# Patient Record
Sex: Female | Born: 1995 | Race: White | Hispanic: No | Marital: Married | Smoking: Light tobacco smoker
Health system: Southern US, Community
[De-identification: ages and names within clinical notes are randomized; demographics above are authoritative.]

## PROBLEM LIST (undated history)

## (undated) DIAGNOSIS — Z789 Other specified health status: Secondary | ICD-10-CM

## (undated) HISTORY — PX: TONSILLECTOMY: SUR1361

---

## 2014-04-15 ENCOUNTER — Other Ambulatory Visit (HOSPITAL_COMMUNITY): Payer: Self-pay | Admitting: Obstetrics and Gynecology

## 2014-04-15 DIAGNOSIS — O403XX1 Polyhydramnios, third trimester, fetus 1: Secondary | ICD-10-CM

## 2014-04-15 DIAGNOSIS — R9389 Abnormal findings on diagnostic imaging of other specified body structures: Secondary | ICD-10-CM

## 2014-04-19 ENCOUNTER — Ambulatory Visit (HOSPITAL_COMMUNITY)
Admission: RE | Admit: 2014-04-19 | Discharge: 2014-04-19 | Disposition: A | Discharge status: Home / Self Care | Payer: Medicaid Other | Source: Ambulatory Visit | Attending: Obstetrics and Gynecology | Admitting: Obstetrics and Gynecology

## 2014-04-19 ENCOUNTER — Other Ambulatory Visit (HOSPITAL_COMMUNITY): Payer: Self-pay | Admitting: Obstetrics and Gynecology

## 2014-04-19 ENCOUNTER — Encounter (HOSPITAL_COMMUNITY): Payer: Self-pay

## 2014-04-19 ENCOUNTER — Other Ambulatory Visit (HOSPITAL_COMMUNITY): Payer: Self-pay

## 2014-04-19 ENCOUNTER — Ambulatory Visit (HOSPITAL_COMMUNITY): Payer: Medicaid Other

## 2014-04-19 DIAGNOSIS — Z3A35 35 weeks gestation of pregnancy: Secondary | ICD-10-CM | POA: Insufficient documentation

## 2014-04-19 DIAGNOSIS — R9389 Abnormal findings on diagnostic imaging of other specified body structures: Secondary | ICD-10-CM

## 2014-04-19 DIAGNOSIS — O403XX1 Polyhydramnios, third trimester, fetus 1: Secondary | ICD-10-CM

## 2014-04-19 DIAGNOSIS — O409XX0 Polyhydramnios, unspecified trimester, not applicable or unspecified: Secondary | ICD-10-CM | POA: Insufficient documentation

## 2014-04-19 DIAGNOSIS — O403XX0 Polyhydramnios, third trimester, not applicable or unspecified: Secondary | ICD-10-CM | POA: Diagnosis not present

## 2014-04-19 NOTE — Consult Note (Signed)
Maternal Fetal Medicine Consultation  Requesting Provider(s): Silas Floodionne Galloway, MD  Reason for consultation: Rapidly progressing, massive polyhydramnios  HPI: Martha Reynolds is a 18 year old G3P2002, EDD 05/18/2014 currently at 5337w6d who was seen for consultation due to massive polyhydramnios.   Mild polyhydramnios was initially detected on ultrasound at 29 weeks (AFI at that time was 27 cm). No fetal anomalies were detected. Her 1 hr and 3-hr OGTT were both normal.  She has been followed with weekly BPPs and AFIs since her diagnosis.  Last week, she was noted to have an AFI of > 40 and is now seen for further evaluation and recommendations for delivery.  Her Past OB history is remarkable for a term C-section due to arrest of dilation and a subsequent repeat C-section.  Her prenatal course has otherwise been uncomplicated.  She reports today that she is uncomfortable, but denies any shortness of breath or uterine contractions.  The fetus is active.  OB History: OB History    Gravida Para Term Preterm AB TAB SAB Ectopic Multiple Living   3 2 2  0 0 0 0 0 0 2      PMH: No past medical history on file.  PSH:  Past Surgical History  Procedure Laterality Date  . Cesarean section     Meds: Prenatal vitamins  Allergies: No Known Allergies  FH: father of the baby has congenital deafness of the right ear. Otherwise denies family history of birth defects or hereditary disorders. Paternal grandfather with diabetes.  Great-grandmother- breat cancer.  Soc: smokes "about 4 cigarettes/day".  Denies ETOH use   Review of Systems: no vaginal bleeding or cramping/contractions, no LOF, no nausea/vomiting. All other systems reviewed and are negative.   PE: 103/65, 115    GEN: well-appearing female ABD: gravid, NT  Ultrasound: Single IUP at 35w 6d Patient referred due to rapidly progressing polyhydramnios- had normal 3-hr OGTT On some images, there is a suspicion of a right clubbed foot The  remainder of the fetal anatomy appears normal, although somewhat limited due to late gestational age A normal stomach bubble was noted BPP 8/8 Massive polyhydramnios noted - AFI 50.6 cm  A/P: 1) Single IUP at 35w 6d         2) Massive, rapidly progressive polyhydramnios - Findings and limitations of the study were discussed with the patient.  We reviewed the differential diagnosis of polyhydramnios.  No clear etiology noted by ultrasound, but would be concerned about a possible fetal neuromuscular disorder given the degree of polyhydramnios.  Amnioreduction was offered for both karyotyping and for symptomatic relief.  The patient declined this procedure.  Recommendations: Given the degree of polyhydramnios and suspicion of a genetic syndrome / neuromuscular disorder, would error on the side of caution [place]. Arrangements made to transfer care to [facility] practice Will begin 2x weekly antenatal testing with AFIs. If testing remains reassuring, will try to deliver at 39 weeks.  May require delivery earlier based on symptoms.   Thank you for the opportunity to be a part of the care of Johnson ControlsFrankie Gierke. Please contact our office if we can be of further assistance.   I spent approximately 30 minutes with this patient with over 50% of time spent in face-to-face counseling.  Alpha GulaPaul Amiayah Giebel, MD Maternal Fetal Medicine

## 2014-04-22 ENCOUNTER — Encounter (HOSPITAL_COMMUNITY): Payer: Self-pay

## 2014-04-22 ENCOUNTER — Other Ambulatory Visit (HOSPITAL_COMMUNITY): Payer: Self-pay | Admitting: Maternal and Fetal Medicine

## 2014-04-22 ENCOUNTER — Other Ambulatory Visit (HOSPITAL_COMMUNITY): Payer: Self-pay | Admitting: Obstetrics and Gynecology

## 2014-04-22 ENCOUNTER — Ambulatory Visit (HOSPITAL_COMMUNITY)
Admission: RE | Admit: 2014-04-22 | Discharge: 2014-04-22 | Disposition: A | Discharge status: Home / Self Care | Payer: Medicaid Other | Source: Ambulatory Visit | Attending: Obstetrics and Gynecology | Admitting: Obstetrics and Gynecology

## 2014-04-22 DIAGNOSIS — O3093 Multiple gestation, unspecified, third trimester: Secondary | ICD-10-CM | POA: Insufficient documentation

## 2014-04-22 DIAGNOSIS — O403XX1 Polyhydramnios, third trimester, fetus 1: Secondary | ICD-10-CM

## 2014-04-22 DIAGNOSIS — O403XX0 Polyhydramnios, third trimester, not applicable or unspecified: Secondary | ICD-10-CM | POA: Insufficient documentation

## 2014-04-22 DIAGNOSIS — Z3A00 Weeks of gestation of pregnancy not specified: Secondary | ICD-10-CM | POA: Insufficient documentation

## 2014-04-22 HISTORY — DX: Other specified health status: Z78.9

## 2014-04-25 ENCOUNTER — Encounter: Payer: Medicaid Other | Admitting: Obstetrics and Gynecology

## 2014-04-26 ENCOUNTER — Ambulatory Visit (HOSPITAL_COMMUNITY)
Admission: RE | Admit: 2014-04-26 | Discharge: 2014-04-26 | Disposition: A | Discharge status: Home / Self Care | Payer: Medicaid Other | Source: Ambulatory Visit | Attending: Obstetrics and Gynecology | Admitting: Obstetrics and Gynecology

## 2014-04-26 DIAGNOSIS — O358XX0 Maternal care for other (suspected) fetal abnormality and damage, not applicable or unspecified: Secondary | ICD-10-CM | POA: Diagnosis not present

## 2014-04-26 DIAGNOSIS — Z3A36 36 weeks gestation of pregnancy: Secondary | ICD-10-CM | POA: Insufficient documentation

## 2014-04-26 DIAGNOSIS — O3421 Maternal care for scar from previous cesarean delivery: Secondary | ICD-10-CM | POA: Insufficient documentation

## 2014-04-26 DIAGNOSIS — O403XX1 Polyhydramnios, third trimester, fetus 1: Secondary | ICD-10-CM

## 2014-04-26 DIAGNOSIS — O99213 Obesity complicating pregnancy, third trimester: Secondary | ICD-10-CM | POA: Diagnosis not present

## 2014-04-26 DIAGNOSIS — O403XX0 Polyhydramnios, third trimester, not applicable or unspecified: Secondary | ICD-10-CM | POA: Insufficient documentation

## 2014-04-29 ENCOUNTER — Encounter (HOSPITAL_COMMUNITY): Payer: Self-pay

## 2014-04-29 ENCOUNTER — Ambulatory Visit (HOSPITAL_COMMUNITY)
Admission: RE | Admit: 2014-04-29 | Discharge: 2014-04-29 | Disposition: A | Discharge status: Home / Self Care | Payer: Medicaid Other | Source: Ambulatory Visit | Attending: Obstetrics and Gynecology | Admitting: Obstetrics and Gynecology

## 2014-04-29 DIAGNOSIS — Z3A36 36 weeks gestation of pregnancy: Secondary | ICD-10-CM | POA: Insufficient documentation

## 2014-04-29 DIAGNOSIS — O403XX1 Polyhydramnios, third trimester, fetus 1: Secondary | ICD-10-CM | POA: Diagnosis not present

## 2015-02-22 ENCOUNTER — Encounter (HOSPITAL_COMMUNITY): Payer: Self-pay | Admitting: *Deleted

## 2016-10-27 IMAGING — US US OB LIMITED
1 series · 13 of 18 positions shown · non-contrast
Comparison: none

[Series 1: us ob limited · 0.28mm/px · 13 of 18 slices shown]
[im 1/18]
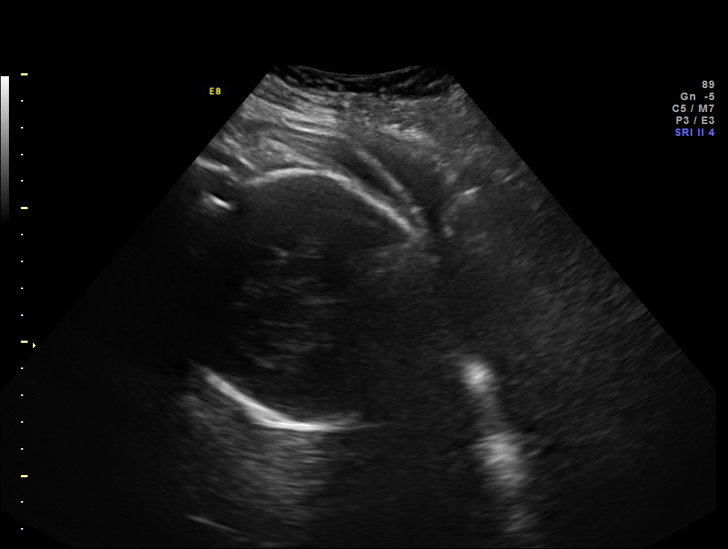
[im 3/18]
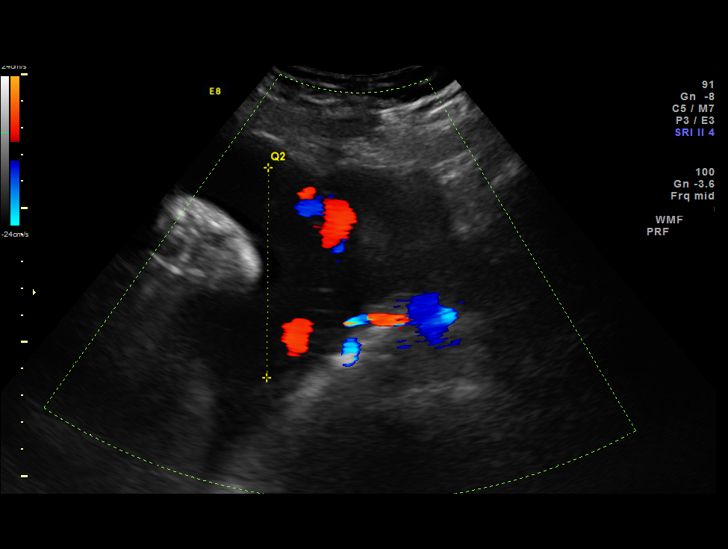
[im 4/18]
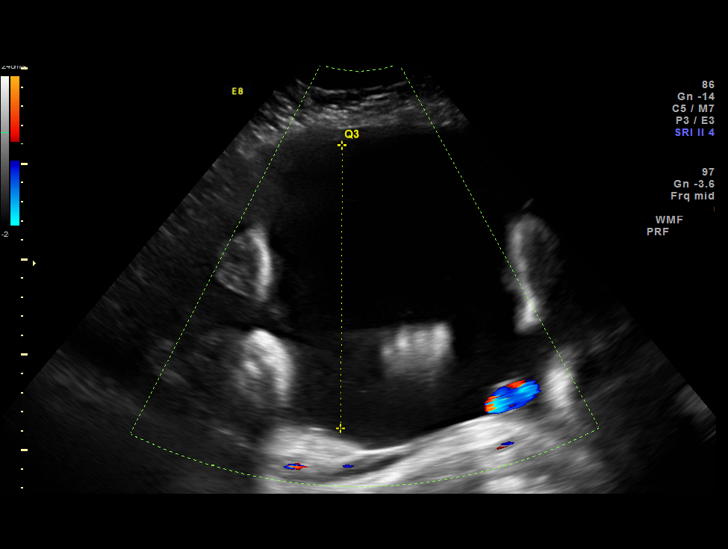
[im 5/18]
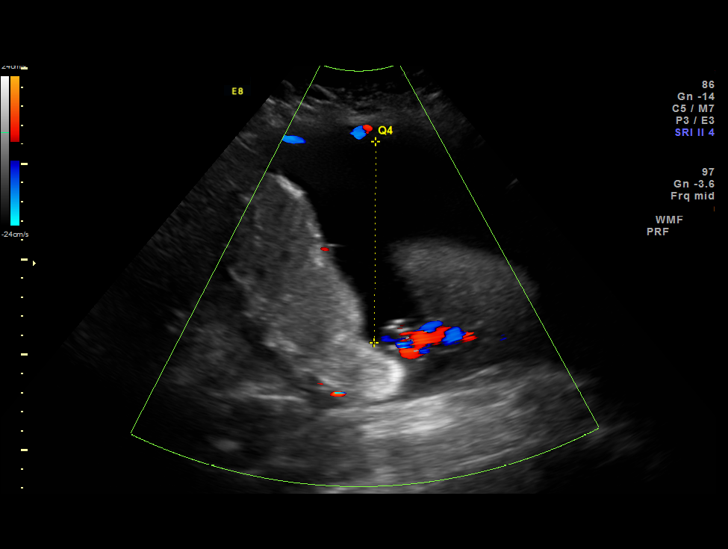
[im 7/18]
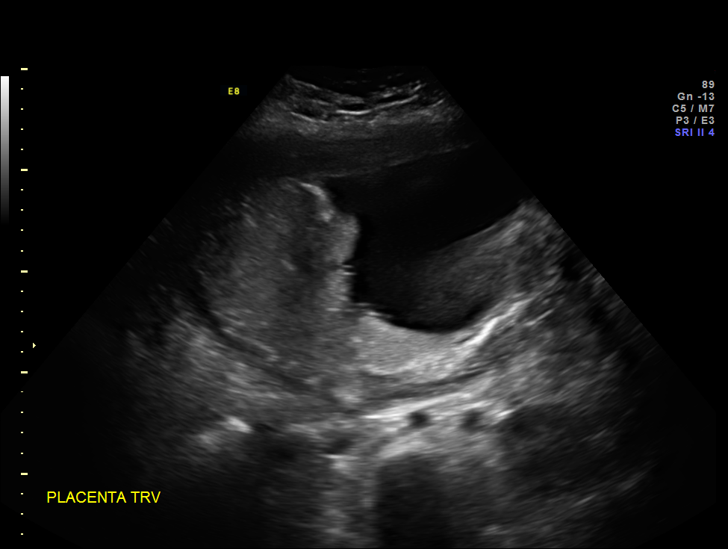
[im 8/18]
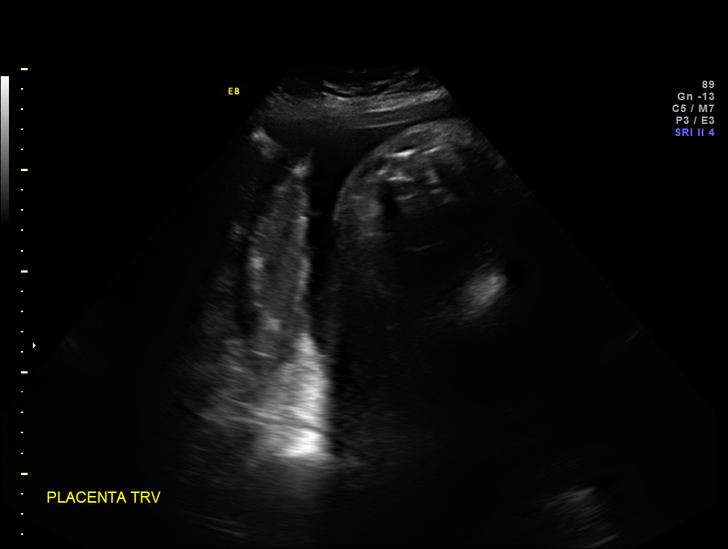
[im 10/18]
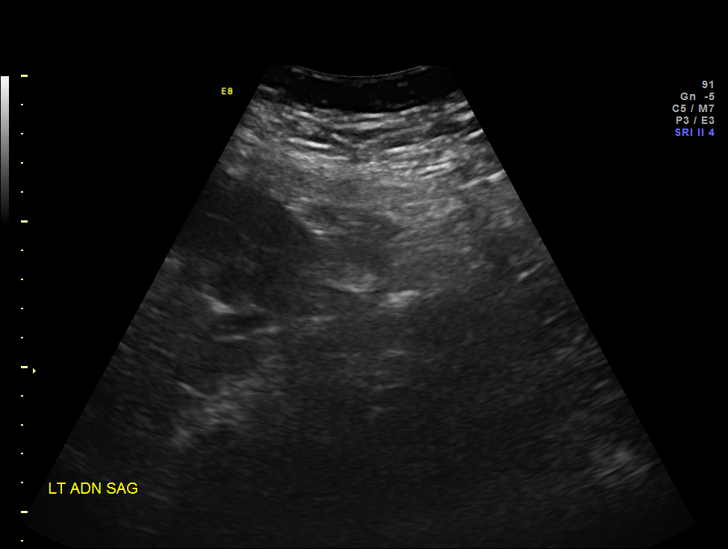
[im 11/18]
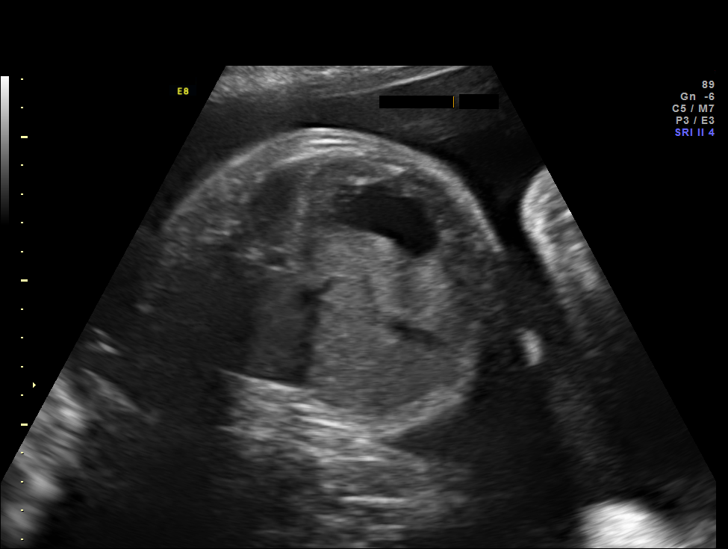
[im 12/18]
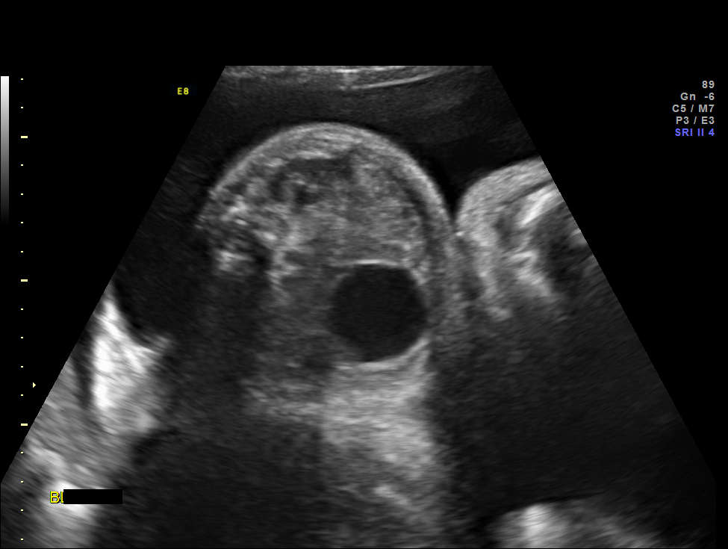
[im 14/18]
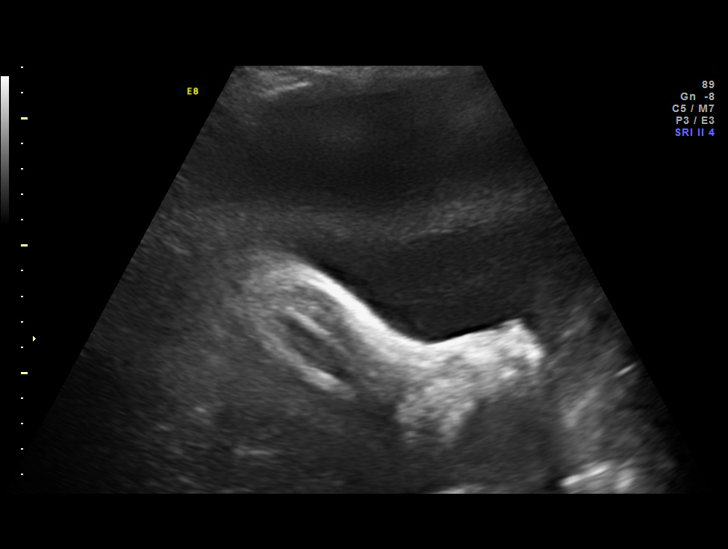
[im 15/18]
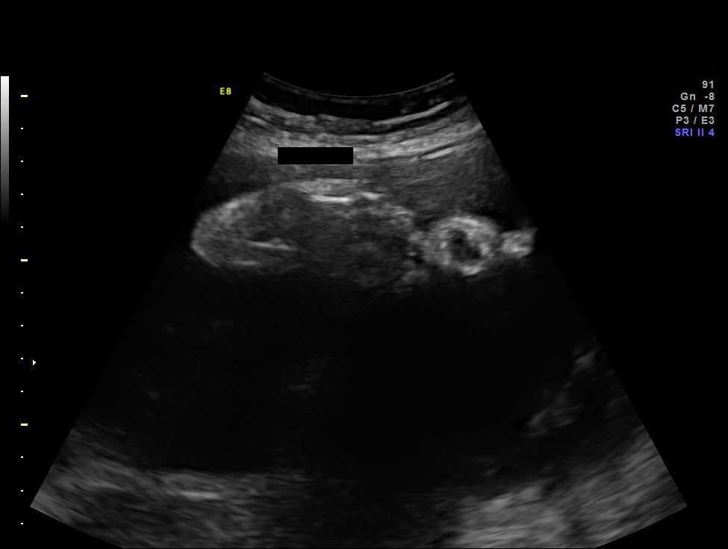
[im 16/18]
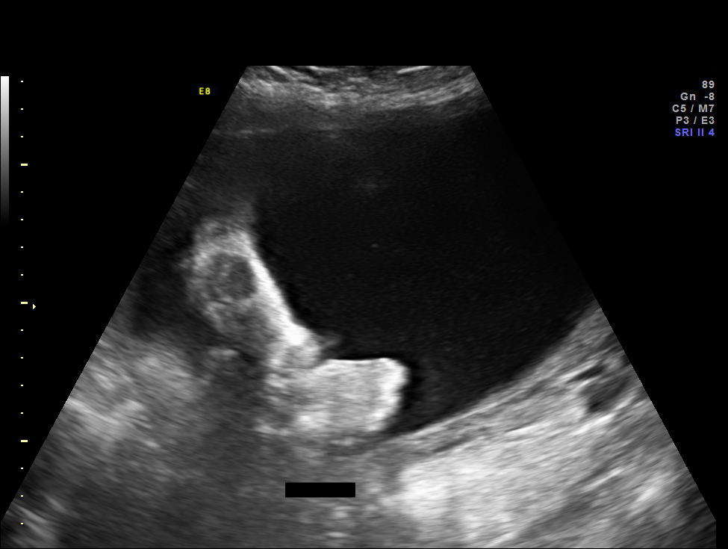
[im 18/18]
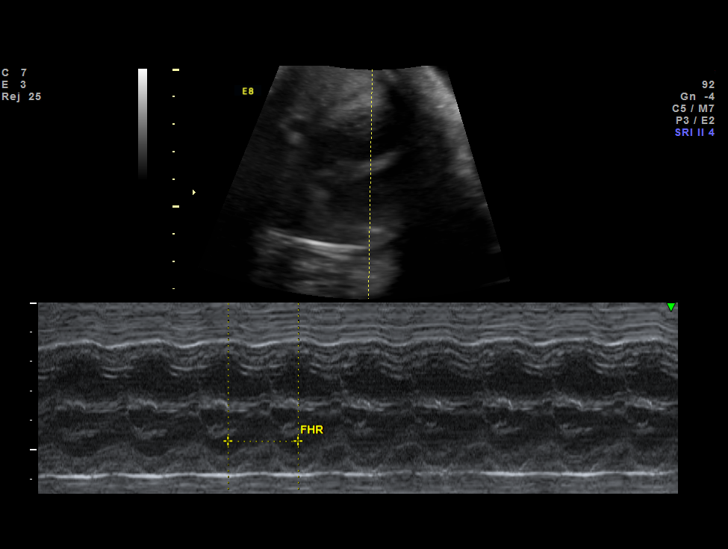

[13 of 18 positions shown; findings below may reference images not displayed]

OBSTETRICS REPORT
                      (Signed Final 04/26/2014 [DATE])

Service(s) Provided

 [HOSPITAL]                                         76815.0
Indications

 Suspected Club foot
 Previous cesarean section x 2
 Maternal morbid obesity
 36 weeks gestation of pregnancy
 Polyhydramnios, third trimester, antepartum
 condition or complication, unspecified fetus
Fetal Evaluation

 Num Of Fetuses:    1
 Fetal Heart Rate:  144                          bpm
 Cardiac Activity:  Observed
 Presentation:      Cephalic
 Placenta:          Posterior, above cervical
                    os
 P. Cord            Previously Visualized
 Insertion:

 Amniotic Fluid
 AFI FV:      Polyhydramnios
 AFI Sum:     38.92   cm     > 97  %Tile     Larg Pckt:  14.84   cm
 RUQ:   5.68    cm   RLQ:    10.54  cm    LUQ:   7.86    cm   LLQ:    14.84  cm
Biophysical Evaluation

 N.S.T:          Reactive
Gestational Age

 LMP:           36w 6d        Date:  08/11/13                 EDD:   05/18/14
 Best:          36w 6d     Det. By:  LMP  (08/11/13)          EDD:   05/18/14
Cervix Uterus Adnexa

 Cervix:       Not visualized (advanced GA >09wks)

 Adnexa:     No abnormality visualized.
Impression

 SIUP at 36+6 weeks
 Mild polyhydramnios
 NST reactive
Recommendations

 Continue twice weekly NSTs with weekly AFIs

 questions or concerns.
